# Patient Record
Sex: Male | Born: 1965 | Race: Black or African American | Hispanic: No | Marital: Single | Smoking: Never smoker
Health system: Southern US, Community
[De-identification: ages and names within clinical notes are randomized; demographics above are authoritative.]

## PROBLEM LIST (undated history)

## (undated) DIAGNOSIS — J302 Other seasonal allergic rhinitis: Secondary | ICD-10-CM

## (undated) DIAGNOSIS — L309 Dermatitis, unspecified: Secondary | ICD-10-CM

## (undated) DIAGNOSIS — Z91018 Allergy to other foods: Secondary | ICD-10-CM

---

## 2012-06-26 ENCOUNTER — Emergency Department (HOSPITAL_COMMUNITY)
Admission: EM | Admit: 2012-06-26 | Discharge: 2012-06-26 | Disposition: A | Discharge status: Home / Self Care | Payer: Managed Care, Other (non HMO) | Attending: Emergency Medicine | Admitting: Emergency Medicine

## 2012-06-26 ENCOUNTER — Emergency Department (HOSPITAL_COMMUNITY): Payer: Managed Care, Other (non HMO)

## 2012-06-26 DIAGNOSIS — S4980XA Other specified injuries of shoulder and upper arm, unspecified arm, initial encounter: Secondary | ICD-10-CM | POA: Insufficient documentation

## 2012-06-26 DIAGNOSIS — R209 Unspecified disturbances of skin sensation: Secondary | ICD-10-CM | POA: Insufficient documentation

## 2012-06-26 DIAGNOSIS — Y9389 Activity, other specified: Secondary | ICD-10-CM | POA: Insufficient documentation

## 2012-06-26 DIAGNOSIS — M7989 Other specified soft tissue disorders: Secondary | ICD-10-CM | POA: Insufficient documentation

## 2012-06-26 DIAGNOSIS — Y92410 Unspecified street and highway as the place of occurrence of the external cause: Secondary | ICD-10-CM | POA: Insufficient documentation

## 2012-06-26 DIAGNOSIS — S46909A Unspecified injury of unspecified muscle, fascia and tendon at shoulder and upper arm level, unspecified arm, initial encounter: Secondary | ICD-10-CM | POA: Insufficient documentation

## 2012-06-26 DIAGNOSIS — S0990XA Unspecified injury of head, initial encounter: Secondary | ICD-10-CM | POA: Insufficient documentation

## 2012-06-26 MED ORDER — IBUPROFEN 800 MG PO TABS
800.0000 mg | ORAL_TABLET | Freq: Once | ORAL | Status: AC
  Administered 2012-06-26: 800 mg via ORAL
  Filled 2012-06-26: qty 1

## 2012-06-26 MED ORDER — NAPROXEN 375 MG PO TABS: 375.0000 mg | ORAL_TABLET | Freq: Two times a day (BID) | ORAL | Status: AC

## 2012-06-26 MED ORDER — LORAZEPAM 1 MG PO TABS
1.0000 mg | ORAL_TABLET | Freq: Once | ORAL | Status: AC
  Administered 2012-06-26: 1 mg via ORAL
  Filled 2012-06-26: qty 1

## 2012-06-26 MED ORDER — HYDROCODONE-ACETAMINOPHEN 5-325 MG PO TABS: 1.0000 | ORAL_TABLET | Freq: Four times a day (QID) | ORAL | Status: AC | PRN
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
## 2012-06-26 NOTE — ED Notes (Signed)
Patient transported to CT 

## 2012-06-26 NOTE — ED Notes (Signed)
Pt rolled to L side and removed from back board while maintaining c-spine precautions. Pt denies pain to spine. Pt just states that his L shoulder is painful.
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
## 2012-06-26 NOTE — ED Provider Notes (Signed)
History/physical exam/procedure(s) were performed by non-physician practitioner and as supervising physician I was immediately available for consultation/collaboration. I have reviewed all notes and am in agreement with care and plan.   Ericha Whittingham S Mazi Brailsford, MD 06/26/12 2334 

## 2013-11-14 ENCOUNTER — Emergency Department (INDEPENDENT_AMBULATORY_CARE_PROVIDER_SITE_OTHER)
Admission: EM | Admit: 2013-11-14 | Discharge: 2013-11-14 | Disposition: A | Discharge status: Home / Self Care | Payer: Managed Care, Other (non HMO) | Source: Home / Self Care

## 2013-11-14 ENCOUNTER — Encounter (HOSPITAL_COMMUNITY): Payer: Self-pay | Admitting: Emergency Medicine

## 2013-11-14 DIAGNOSIS — L0390 Cellulitis, unspecified: Secondary | ICD-10-CM

## 2013-11-14 DIAGNOSIS — L918 Other hypertrophic disorders of the skin: Secondary | ICD-10-CM

## 2013-11-14 DIAGNOSIS — B353 Tinea pedis: Secondary | ICD-10-CM

## 2013-11-14 DIAGNOSIS — L0291 Cutaneous abscess, unspecified: Secondary | ICD-10-CM

## 2013-11-14 DIAGNOSIS — R238 Other skin changes: Secondary | ICD-10-CM

## 2013-11-14 DIAGNOSIS — R609 Edema, unspecified: Secondary | ICD-10-CM

## 2013-11-14 DIAGNOSIS — L909 Atrophic disorder of skin, unspecified: Secondary | ICD-10-CM

## 2013-11-14 DIAGNOSIS — R600 Localized edema: Secondary | ICD-10-CM

## 2013-11-14 DIAGNOSIS — L908 Other atrophic disorders of skin: Secondary | ICD-10-CM

## 2013-11-14 HISTORY — DX: Allergy to other foods: Z91.018

## 2013-11-14 HISTORY — DX: Dermatitis, unspecified: L30.9

## 2013-11-14 HISTORY — DX: Other seasonal allergic rhinitis: J30.2

## 2013-11-14 MED ORDER — CEPHALEXIN 500 MG PO CAPS: 500.0000 mg | ORAL_CAPSULE | Freq: Four times a day (QID) | ORAL | Status: DC

## 2013-11-14 MED ORDER — TERBINAFINE HCL 250 MG PO TABS: 250.0000 mg | ORAL_TABLET | Freq: Every day | ORAL | Status: DC

## 2013-11-14 NOTE — ED Provider Notes (Signed)
Medical screening examination/treatment/procedure(s) were performed by a resident physician or non-physician practitioner and as the supervising physician I was immediately available for consultation/collaboration.  Clementeen GrahamEvan Ineze Serrao, MD    Rodolph BongEvan S Zylah Elsbernd, MD 11/14/13 2113

## 2013-11-14 NOTE — Discharge Instructions (Signed)
Your foot is likely infected with both a bacteria and fungus Please start the antifungal and antibacterial medications Please only use petroleum gel for your feet Please stay off your feet this weekend and keep them open to the air Try to keep your feet elevated to help with the edema and consider buying compression stockings Please avoid any other cleaners on your feet outside of daily bathing/showering.

## 2013-11-14 NOTE — ED Notes (Addendum)
C/o foot fungus since 1/15.  Saw his dermatologist and was told soak his feet in dish detergent and then wrap them in saran wrap.  Has been doing that without relief.  Saw Dr. Guinevere Scarletonstead his PCP and was given a cream. He went there first but they close at 0700. Rash goes around his heel and up to his ankle on both feet.  C/o itching and soreness and swelling of his feet.

## 2013-11-14 NOTE — ED Provider Notes (Signed)
CSN: [id number]     Arrival date & time 11/14/13  1933 History   None    Chief Complaint  Patient presents with  . Recurrent Skin Infections   (Consider location/radiation/quality/duration/timing/severity/associated sxs/prior Treatment) HPI  Recurrent Skin infections: feet bilat. Changed detergents in January. Since that time gradual progression of foot skin irritation. Went to PCP and Derm who prescribed steroid ointments w/ some benefit but returns. Pt also self treating w/ antifungal topical medications. Doing foot soaks in dish detergent from time to time w some improvement. A few days ago started to swell and become painful. Minimal discharge from raw areas.    Past Medical History  Diagnosis Date  . Seasonal allergies   . Multiple food allergies   . Eczema    History reviewed. No pertinent past surgical history. Family History  Problem Relation Age of Onset  . Renal Disease Mother    History  Substance Use Topics  . Smoking status: Never Smoker   . Smokeless tobacco: Not on file  . Alcohol Use: Yes     Comment: occasional    Review of Systems  Constitutional: Negative for fever.  Skin: Positive for rash and wound.  All other systems reviewed and are negative.   Allergies  Review of patient's allergies indicates no known allergies.  Home Medications   Prior to Admission medications   Medication Sig Start Date End Date Taking? Authorizing Provider  HYDROcodone-acetaminophen (NORCO/VICODIN) 5-325 MG per tablet Take 1 tablet by mouth every 6 (six) hours as needed for pain. 06/26/12   Lisette Paz, PA-C  loratadine (CLARITIN) 10 MG tablet Take 10 mg by mouth as needed. Allergies    Historical Provider, MD  naproxen (NAPROSYN) 375 MG tablet Take 1 tablet (375 mg total) by mouth 2 (two) times daily. 06/26/12   Lisette Paz, PA-C   BP 143/77  Pulse 83  Temp(Src) 99 F (37.2 C) (Oral) Physical Exam  Constitutional: He appears well-developed and well-nourished. No  distress.  HENT:  Head: Normocephalic and atraumatic.  Eyes: EOM are normal. Pupils are equal, round, and reactive to light.  Neck: Normal range of motion.  Pulmonary/Chest: Effort normal. No respiratory distress.  Abdominal: He exhibits no distension.  Musculoskeletal: Normal range of motion.  2+ Le edema  Skin: Skin is warm.  bilat foot skin peeling especially around the heels. Mild skin ulceration into the dermis of the L medial heel. Other areas of thick keratotic skin w/ flaking and mild erythema. onychomychotic toes. 2+ pedal pulses  Psychiatric: He has a normal mood and affect. His behavior is normal. Judgment and thought content normal.    ED Course  Procedures (including critical care time) Labs Review Labs Reviewed - No data to display  No results found for this or any previous visit. Imaging Review No results found.   MDM   1. Cellulitis   2. Tinea pedis   3. Skin breakdown   4. Lower extremity edema    48yo M w/ dependent venous stasis edema w/ secondary likely fungal infection and skin breakdown (w/ some being caused by chemical irritation) and mild cellulitis. - start Terbinafine 250mg  Daily x14 days - Start Keflex 500mg  BID x 10 ays - daily showering and petroleum gelly only for moisterizing - pt to stay off feet x 3 days adn to keep elevated - precautions given and all questions answered.  ' Shelly Flattenavid Marguriete Wootan, MD Family Medicine PGY-3 11/14/2013, 8:54 PM      Ozella Rocksavid J Alexzandria Massman, MD 11/14/13 [phone]

## 2018-01-03 ENCOUNTER — Emergency Department (HOSPITAL_COMMUNITY)
Admission: EM | Admit: 2018-01-03 | Discharge: 2018-01-03 | Disposition: A | Discharge status: Home / Self Care | Payer: Managed Care, Other (non HMO) | Attending: Emergency Medicine | Admitting: Emergency Medicine

## 2018-01-03 ENCOUNTER — Encounter (HOSPITAL_COMMUNITY): Payer: Self-pay | Admitting: Emergency Medicine

## 2018-01-03 ENCOUNTER — Emergency Department (HOSPITAL_COMMUNITY): Payer: Managed Care, Other (non HMO)

## 2018-01-03 DIAGNOSIS — R1032 Left lower quadrant pain: Secondary | ICD-10-CM

## 2018-01-03 DIAGNOSIS — J302 Other seasonal allergic rhinitis: Secondary | ICD-10-CM | POA: Diagnosis not present

## 2018-01-03 DIAGNOSIS — R0789 Other chest pain: Secondary | ICD-10-CM | POA: Diagnosis not present

## 2018-01-03 DIAGNOSIS — Z79899 Other long term (current) drug therapy: Secondary | ICD-10-CM | POA: Insufficient documentation

## 2018-01-03 DIAGNOSIS — M7918 Myalgia, other site: Secondary | ICD-10-CM

## 2018-01-03 DIAGNOSIS — R079 Chest pain, unspecified: Secondary | ICD-10-CM | POA: Diagnosis present

## 2018-01-03 LAB — COMPREHENSIVE METABOLIC PANEL
ALT: 21 U/L (ref 17–63)
AST: 29 U/L (ref 15–41)
Albumin: 3.8 g/dL (ref 3.5–5.0)
Alkaline Phosphatase: 57 U/L (ref 38–126)
Anion gap: 6 (ref 5–15)
BUN: 15 mg/dL (ref 6–20)
CO2: 30 mmol/L (ref 22–32)
Calcium: 9.7 mg/dL (ref 8.9–10.3)
Chloride: 107 mmol/L (ref 101–111)
Creatinine, Ser: 1.06 mg/dL (ref 0.61–1.24)
GFR calc Af Amer: >60 mL/min (ref >60)
GFR calc non Af Amer: >60 mL/min (ref >60)
Glucose, Bld: 121 mg/dL — ABNORMAL HIGH (ref 65–99)
Potassium: 3.7 mmol/L (ref 3.5–5.1)
Sodium: 143 mmol/L (ref 135–145)
Total Bilirubin: 0.7 mg/dL (ref 0.3–1.2)
Total Protein: 7.1 g/dL (ref 6.5–8.1)

## 2018-01-03 LAB — CBC WITH DIFFERENTIAL/PLATELET
Abs Immature Granulocytes: 0 10*3/uL (ref 0.0–0.1)
Basophils Absolute: 0 10*3/uL (ref 0.0–0.1)
Basophils Relative: 0 %
Eosinophils Absolute: 0.2 10*3/uL (ref 0.0–0.7)
Eosinophils Relative: 2 %
HCT: 43.7 % (ref 39.0–52.0)
Hemoglobin: 13.8 g/dL (ref 13.0–17.0)
Immature Granulocytes: 0 %
Lymphocytes Relative: 16 %
Lymphs Abs: 1.6 10*3/uL (ref 0.7–4.0)
MCH: 26.7 pg (ref 26.0–34.0)
MCHC: 31.6 g/dL (ref 30.0–36.0)
MCV: 84.5 fL (ref 78.0–100.0)
Monocytes Absolute: 0.7 10*3/uL (ref 0.1–1.0)
Monocytes Relative: 7 %
Neutro Abs: 7.6 10*3/uL (ref 1.7–7.7)
Neutrophils Relative %: 75 %
Platelets: 200 10*3/uL (ref 150–400)
RBC: 5.17 MIL/uL (ref 4.22–5.81)
RDW: 14.4 % (ref 11.5–15.5)
WBC: 10.2 10*3/uL (ref 4.0–10.5)

## 2018-01-03 LAB — I-STAT TROPONIN, ED: Troponin i, poc: 0 ng/mL (ref 0.00–0.08)

## 2018-01-03 LAB — URINALYSIS, ROUTINE W REFLEX MICROSCOPIC
Bilirubin Urine: NEGATIVE
Glucose, UA: NEGATIVE mg/dL
Hgb urine dipstick: NEGATIVE
Ketones, ur: NEGATIVE mg/dL
Leukocytes, UA: NEGATIVE
Nitrite: NEGATIVE
Protein, ur: NEGATIVE mg/dL
Specific Gravity, Urine: 1.024 (ref 1.005–1.030)
pH: 5 (ref 5.0–8.0)

## 2018-01-03 MED ORDER — IOPAMIDOL (ISOVUE-370) INJECTION 76%: 100.0000 mL | Freq: Once | INTRAVENOUS | Status: DC | PRN

## 2018-01-03 MED ORDER — IOHEXOL 300 MG/ML  SOLN
100.0000 mL | Freq: Once | INTRAMUSCULAR | Status: AC | PRN
  Administered 2018-01-03: 100 mL via INTRAVENOUS

## 2018-01-03 MED ORDER — METHOCARBAMOL 500 MG PO TABS: 500.0000 mg | ORAL_TABLET | Freq: Two times a day (BID) | ORAL | 0 refills | Status: AC

## 2018-01-03 MED ORDER — ACETAMINOPHEN 325 MG PO TABS
650.0000 mg | ORAL_TABLET | Freq: Once | ORAL | Status: AC
  Administered 2018-01-03: 650 mg via ORAL
  Filled 2018-01-03: qty 2

## 2018-01-03 NOTE — Discharge Instructions (Addendum)
Please read attached information. If you experience any new or worsening signs or symptoms please return to the emergency room for evaluation. Please follow-up with your primary care provider or specialist as discussed. Please use medication prescribed only as directed and discontinue taking if you have any concerning signs or symptoms.   °
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
## 2018-01-03 NOTE — ED Notes (Signed)
Pt verbalized understanding discharge instructions and denies any further needs or questions at this time. VS stable, ambulatory and steady gait.   
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
## 2023-03-02 ENCOUNTER — Encounter: Payer: Self-pay | Admitting: Podiatry

## 2023-03-02 ENCOUNTER — Ambulatory Visit: Payer: Self-pay | Admitting: Podiatry

## 2023-03-02 DIAGNOSIS — B351 Tinea unguium: Secondary | ICD-10-CM

## 2023-03-02 DIAGNOSIS — E1159 Type 2 diabetes mellitus with other circulatory complications: Secondary | ICD-10-CM | POA: Diagnosis not present

## 2023-03-02 DIAGNOSIS — M79674 Pain in right toe(s): Secondary | ICD-10-CM

## 2023-03-02 DIAGNOSIS — M79675 Pain in left toe(s): Secondary | ICD-10-CM | POA: Diagnosis not present

## 2023-03-02 NOTE — Progress Notes (Signed)
This patient presents to the office with chief complaint of long thick nails and diabetic feet.  This patient  says there  is  no pain and discomfort in their feet.  This patient says there are long thick painful nails.  These nails are painful walking and wearing shoes.  Patient has no history of infection or drainage from both feet.  Patient is unable to  self treat his own nails . This patient presents  to the office today for treatment of the  long nails and a foot evaluation due to history of  diabetes.  General Appearance  Alert, conversant and in no acute stress.  Vascular  Dorsalis pedis are palpable  bilaterally.  Posterior tibial pulses are not palpable due to swelling.  Capillary return is within normal limits  bilaterally. Temperature is within normal limits  bilaterally.  Neurologic  Senn-Weinstein monofilament wire test within normal limits  bilaterally. Muscle power within normal limits bilaterally.  Nails Thick disfigured discolored nails with subungual debris  from hallux to fifth toes bilaterally. No evidence of bacterial infection or drainage bilaterally.  Orthopedic  No limitations of motion of motion feet .  No crepitus or effusions noted.  No bony pathology or digital deformities noted. Mild  HAV  B/L.  Skin  normotropic skin with no porokeratosis noted bilaterally.  No signs of infections or ulcers noted.     Onychomycosis  Diabetes with no foot complications  IE  Debride nails x 10.  A diabetic foot exam was performed and there is no evidence of  neurologic pathology.   RTC 3 months.   Helane Gunther DPM

## 2023-06-01 ENCOUNTER — Ambulatory Visit: Payer: Managed Care, Other (non HMO) | Admitting: Podiatry

## 2023-07-06 ENCOUNTER — Ambulatory Visit (INDEPENDENT_AMBULATORY_CARE_PROVIDER_SITE_OTHER): Payer: Medicaid Other | Admitting: Podiatry

## 2023-07-06 DIAGNOSIS — E1159 Type 2 diabetes mellitus with other circulatory complications: Secondary | ICD-10-CM | POA: Diagnosis not present

## 2023-07-06 DIAGNOSIS — M79674 Pain in right toe(s): Secondary | ICD-10-CM | POA: Diagnosis not present

## 2023-07-06 DIAGNOSIS — M79675 Pain in left toe(s): Secondary | ICD-10-CM

## 2023-07-06 DIAGNOSIS — B351 Tinea unguium: Secondary | ICD-10-CM | POA: Diagnosis not present

## 2023-07-06 NOTE — Progress Notes (Signed)
This patient returns to my office for at risk foot care.  This patient requires this care by a professional since this patient will be at risk due to having diabetes.  This patient is unable to cut nails himself since the patient cannot reach his nails.These nails are painful walking and wearing shoes.  This patient presents for at risk foot care today.  General Appearance  Alert, conversant and in no acute stress.  Vascular  Dorsalis pedis and posterior tibial  pulses are  weakly palpable  bilaterally.  Capillary return is within normal limits  bilaterally. Temperature is within normal limits  bilaterally.  Neurologic  Senn-Weinstein monofilament wire test within normal limits  bilaterally. Muscle power within normal limits bilaterally.  Nails Thick disfigured discolored nails with subungual debris  from hallux to fifth toes bilaterally. No evidence of bacterial infection or drainage bilaterally.  Orthopedic  No limitations of motion  feet .  No crepitus or effusions noted.  No bony pathology or digital deformities noted.  Skin  normotropic skin with no porokeratosis noted bilaterally.  No signs of infections or ulcers noted.     Onychomycosis  Pain in right toes  Pain in left toes  Consent was obtained for treatment procedures.   Mechanical debridement of nails 1-5  bilaterally performed with a nail nipper.  Filed with dremel without incident.    Return office visit     3 months                 Told patient to return for periodic foot care and evaluation due to potential at risk complications.   Desiray Orchard DPM   

## 2023-10-04 ENCOUNTER — Encounter: Payer: Self-pay | Admitting: Podiatry

## 2023-10-04 ENCOUNTER — Ambulatory Visit (INDEPENDENT_AMBULATORY_CARE_PROVIDER_SITE_OTHER): Payer: Medicaid Other | Admitting: Podiatry

## 2023-10-04 DIAGNOSIS — M79675 Pain in left toe(s): Secondary | ICD-10-CM

## 2023-10-04 DIAGNOSIS — B351 Tinea unguium: Secondary | ICD-10-CM

## 2023-10-04 DIAGNOSIS — M79674 Pain in right toe(s): Secondary | ICD-10-CM

## 2023-10-04 DIAGNOSIS — E1159 Type 2 diabetes mellitus with other circulatory complications: Secondary | ICD-10-CM

## 2023-10-04 NOTE — Progress Notes (Signed)
 This patient returns to my office for at risk foot care.  This patient requires this care by a professional since this patient will be at risk due to having diabetes.  This patient is unable to cut nails himself since the patient cannot reach his nails.These nails are painful walking and wearing shoes.  This patient presents for at risk foot care today.  General Appearance  Alert, conversant and in no acute stress.  Vascular  Dorsalis pedis and posterior tibial  pulses are weakly  palpable  bilaterally.  Capillary return is within normal limits  bilaterally. Temperature is within normal limits  bilaterally.  Neurologic  Senn-Weinstein monofilament wire test within normal limits  bilaterally. Muscle power within normal limits bilaterally.  Nails Thick disfigured discolored nails with subungual debris  from hallux to fifth toes bilaterally. No evidence of bacterial infection or drainage bilaterally.  Orthopedic  No limitations of motion  feet .  No crepitus or effusions noted.  No bony pathology or digital deformities noted.  Skin  normotropic skin with no porokeratosis noted bilaterally.  No signs of infections or ulcers noted.     Onychomycosis  Pain in right toes  Pain in left toes  Consent was obtained for treatment procedures.   Mechanical debridement of nails 1-5  bilaterally performed with a nail nipper.  Filed with dremel without incident.    Return office visit     3 months                 Told patient to return for periodic foot care and evaluation due to potential at risk complications.   Helane Gunther DPM

## 2024-01-04 ENCOUNTER — Ambulatory Visit: Admitting: Podiatry
